# Patient Record
Sex: Male | Born: 1995 | Hispanic: Yes | Marital: Single | Smoking: Never smoker
Health system: Southern US, Community
[De-identification: ages and names within clinical notes are randomized; demographics above are authoritative.]

---

## 2016-03-26 ENCOUNTER — Emergency Department (HOSPITAL_COMMUNITY): Payer: Self-pay

## 2016-03-26 ENCOUNTER — Emergency Department (HOSPITAL_COMMUNITY)
Admission: EM | Admit: 2016-03-26 | Discharge: 2016-03-26 | Disposition: A | Discharge status: Home / Self Care | Payer: Self-pay | Attending: Emergency Medicine | Admitting: Emergency Medicine

## 2016-03-26 ENCOUNTER — Encounter (HOSPITAL_COMMUNITY): Payer: Self-pay

## 2016-03-26 DIAGNOSIS — S59912A Unspecified injury of left forearm, initial encounter: Secondary | ICD-10-CM | POA: Insufficient documentation

## 2016-03-26 DIAGNOSIS — W132XXA Fall from, out of or through roof, initial encounter: Secondary | ICD-10-CM | POA: Insufficient documentation

## 2016-03-26 DIAGNOSIS — S79911A Unspecified injury of right hip, initial encounter: Secondary | ICD-10-CM | POA: Insufficient documentation

## 2016-03-26 DIAGNOSIS — S79921A Unspecified injury of right thigh, initial encounter: Secondary | ICD-10-CM | POA: Insufficient documentation

## 2016-03-26 DIAGNOSIS — Y998 Other external cause status: Secondary | ICD-10-CM | POA: Insufficient documentation

## 2016-03-26 DIAGNOSIS — Y9289 Other specified places as the place of occurrence of the external cause: Secondary | ICD-10-CM | POA: Insufficient documentation

## 2016-03-26 DIAGNOSIS — S4991XA Unspecified injury of right shoulder and upper arm, initial encounter: Secondary | ICD-10-CM | POA: Insufficient documentation

## 2016-03-26 DIAGNOSIS — Y9389 Activity, other specified: Secondary | ICD-10-CM | POA: Insufficient documentation

## 2016-03-26 DIAGNOSIS — S3992XA Unspecified injury of lower back, initial encounter: Secondary | ICD-10-CM | POA: Insufficient documentation

## 2016-03-26 DIAGNOSIS — W19XXXA Unspecified fall, initial encounter: Secondary | ICD-10-CM

## 2016-03-26 MED ORDER — NAPROXEN 250 MG PO TABS
500.0000 mg | ORAL_TABLET | Freq: Once | ORAL | Status: AC
  Administered 2016-03-26: 500 mg via ORAL
  Filled 2016-03-26: qty 2

## 2016-03-26 MED ORDER — DIAZEPAM 5 MG PO TABS: 5.0000 mg | ORAL_TABLET | Freq: Two times a day (BID) | ORAL | Status: AC

## 2016-03-26 MED ORDER — DIAZEPAM 5 MG PO TABS
5.0000 mg | ORAL_TABLET | Freq: Once | ORAL | Status: AC
  Administered 2016-03-26: 5 mg via ORAL
  Filled 2016-03-26: qty 1

## 2016-03-26 MED ORDER — NAPROXEN 500 MG PO TABS: 500.0000 mg | ORAL_TABLET | Freq: Two times a day (BID) | ORAL | Status: AC

## 2016-03-26 NOTE — Discharge Instructions (Signed)
Mr. Seth Alvarez,  Nice meeting you! Please follow-up with your primary care provider. If you do not have one, call the number attached. Return to the emergency department if you develop increased pain, lose consciousness, develop headaches, chest pain, shortness of breath, dizziness, lose control of your bladder/bowel, new/worsening symptoms. Feel better soon!  S. Lane HackerNicole Pearlene Teat, PA-C

## 2016-03-26 NOTE — ED Notes (Addendum)
Patient fell from 2 story roof this am at 10am. Patient grabbed rope and helped break the fall, no loc. Complains of thoracic back pain and right leg pain. Ambulatory on arrival, no deformity noted. Alert and oriented.

## 2016-03-26 NOTE — ED Notes (Signed)
Pt states he fell off of roof, fall broken by harness. C/o right shoulder pain, right thigh pain. Abrasion left posterior forearm.

## 2016-03-26 NOTE — ED Provider Notes (Signed)
CSN: [id number]     Arrival date & time 03/26/16  1410 History  By signing my name below, I, [facility], attest that this documentation has been prepared under the direction and in the presence of Melton KrebsSamantha Nicole Folashade Gamboa, PA-C. Electronically Signed: [name] PatientMarrissa Alvarez, ED Scribe. 03/26/2016. 5:22 PM.   Chief Complaint  Patient presents with  . fall from roof    The history is provided by the patient. No language interpreter was used.   HPI Comments: Seth Alvarez is a 20 y.o. male who presents to the Emergency Department complaining of constant, mild right shoulder, right thigh, and back pain after a fall from a 2-story roof that occurred 5.5 hours ago. Pt states that he was wearing a harness when he fell backwards off the roof, landing on his back and striking his head and the right side of his body on the ground. He reports abrasions to his left forearm. He notes that his harness broke his fall and that he was able to move all extremities without difficulty following impact. Denies taking blood thinners, aspirin, ibuprofen, or any other medications. Denies difficult ambulation LOC, emesis, bowel/bladder incontinence.  History reviewed. No pertinent past medical history. History reviewed. No pertinent past surgical history. No family history on file. Social History  Substance Use Topics  . Smoking status: Never Smoker   . Smokeless tobacco: None  . Alcohol Use: None    Review of Systems A complete 10 system review of systems was obtained and all systems are negative except as noted in the HPI and PMH.   Allergies  Review of patient's allergies indicates not on file.  Home Medications   Prior to Admission medications   Medication Sig Start Date End Date Taking? Authorizing Provider  diazepam (VALIUM) 5 MG tablet Take 1 tablet (5 mg total) by mouth 2 (two) times daily. 03/26/16   Melton KrebsSamantha Nicole Jiya Kissinger, PA-C  naproxen (NAPROSYN) 500 MG tablet Take 1 tablet (500 mg total) by  mouth 2 (two) times daily. 03/26/16   Melton KrebsSamantha Nicole Melika Reder, PA-C   BP 141/89 mmHg  Pulse 78  Temp(Src) 98.6 F (37 C) (Oral)  Resp 18  Ht 5\' 7"  (1.702 m)  Wt 144 lb 1.6 oz (65.363 kg)  BMI 22.56 kg/m2  SpO2 100% Physical Exam  Constitutional: He is oriented to person, place, and time. He appears well-developed and well-nourished. No distress.  HENT:  Head: Normocephalic and atraumatic.  Eyes: Conjunctivae are normal.  Neck: Normal range of motion.  Cardiovascular: Normal rate.   Pulmonary/Chest: Effort normal. No respiratory distress.  Musculoskeletal: Normal range of motion. He exhibits tenderness.  Midline lumbar spine tenderness. Right anterior thigh tenderness. Right paraspinal tenderness. Right hip tenderness.  Neurological: He is alert and oriented to person, place, and time.  Skin: Skin is warm and dry.  Psychiatric: He has a normal mood and affect. His behavior is normal.  Nursing note and vitals reviewed.   ED Course  Procedures   DIAGNOSTIC STUDIES: Oxygen Saturation is 100% on RA, normal by my interpretation.    COORDINATION OF CARE: 3:38 PM Discussed results of right hip and right shoulder imaging. Will order x-rays or right femur and lumbar spine and pain medication. Discussed treatment plan with pt at bedside and pt agreed to plan.  Imaging Review Dg Lumbar Spine Complete  03/26/2016  CLINICAL DATA:  Status post fall from second story roof this afternoon. Low back pain. Initial encounter. EXAM: LUMBAR SPINE - COMPLETE 4+ VIEW COMPARISON:  None. FINDINGS:  There is no evidence of lumbar spine fracture. Alignment is normal. Intervertebral disc spaces are maintained. IMPRESSION: Negative exam. Electronically Signed   By: Drusilla Kanner M.D.   On: 03/26/2016 16:49   Dg Shoulder Right  03/26/2016  CLINICAL DATA:  Larey Seat off roof today at work about 10 a.m., right shoulder pain, right hip pain EXAM: RIGHT SHOULDER - 2+ VIEW COMPARISON:  None. FINDINGS: Three views of the  right shoulder submitted. No acute fracture or subluxation. No radiopaque foreign body. IMPRESSION: Negative. Electronically Signed   By: Natasha Mead M.D.   On: 03/26/2016 14:57   Dg Hip Unilat With Pelvis 2-3 Views Right  03/26/2016  CLINICAL DATA:  Larey Seat from roof at work today x 10 am this morning, landed on back, right shoulder/hip pain. EXAM: DG HIP (WITH OR WITHOUT PELVIS) 2-3V RIGHT COMPARISON:  None. FINDINGS: There is no evidence of hip fracture or dislocation. There is no evidence of arthropathy or other focal bone abnormality. IMPRESSION: Negative. Electronically Signed   By: Amie Portland M.D.   On: 03/26/2016 14:55   Dg Femur, Min 2 Views Right  03/26/2016  CLINICAL DATA:  Larey Seat off of a second story roof this afternoon EXAM: RIGHT FEMUR 2 VIEWS COMPARISON:  None. FINDINGS: There is no evidence of fracture or other focal bone lesions. Soft tissues are unremarkable. IMPRESSION: Negative. Electronically Signed   By: Signa Kell M.D.   On: 03/26/2016 16:57   I have personally reviewed and evaluated these images as part of my medical decision-making.   MDM   Final diagnoses:  Fall, initial encounter   S/p mechanical fall from roof. Patient without signs of serious head, neck, or back injury. Not TTP at chest or abd.   Normal neurological exam. No concern for closed head injury, lung injury, or intraabdominal injury. Normal muscle soreness after fall. Radiology without acute abnormality.  Patient is able to ambulate without difficulty in the ED and will be discharged home with symptomatic therapy. Pt has been instructed to follow up with their doctor if symptoms persist. Home conservative therapies for pain including ice and heat tx have been discussed. Pt is hemodynamically stable, in NAD. Pain has been managed & has no complaints prior to dc.  I personally performed the services described in this documentation, which was scribed in my presence. The recorded information has been reviewed  and is accurate.   Melton Krebs, PA-C 03/31/16 1018  Gerhard Munch, MD 04/04/16 [phone]

## 2016-05-18 ENCOUNTER — Ambulatory Visit (INDEPENDENT_AMBULATORY_CARE_PROVIDER_SITE_OTHER): Payer: Self-pay | Admitting: Urgent Care

## 2016-05-18 VITALS — BP 122/72 | HR 73 | Temp 98.3°F | Resp 12 | Ht 67.0 in | Wt 141.0 lb

## 2016-05-18 DIAGNOSIS — Z202 Contact with and (suspected) exposure to infections with a predominantly sexual mode of transmission: Secondary | ICD-10-CM

## 2016-05-18 MED ORDER — AZITHROMYCIN 500 MG PO TABS
500.0000 mg | ORAL_TABLET | Freq: Every day | ORAL | Status: DC
Start: 2016-05-18 — End: 2018-05-08

## 2016-05-18 NOTE — Progress Notes (Signed)
    MRN: [id number] DOB: [birth year]  Subjective:   Seth Alvarez is a 20 y.o. male presenting for chief complaint of Exposure to STD  Reports that his girlfriend tested positive for chlamydia 2 weeks ago. She has been treated and he is presenting today for treatment as well. Denies fever, penile discharge, dysuria, hematuria, n/v, abdominal pain, genital rashes.  Patient states that he has only been sexually active with one person, I counseled him on safe sex practices.  Norva Pavlovdgar has a current medication list which includes the following prescription(s): diazepam and naproxen. Also has No Known Allergies.  Norva Pavlovdgar  has no past medical history on file. Also  has no past surgical history on file.  Objective:   Vitals: BP 122/72 mmHg  Pulse 73  Temp(Src) 98.3 F (36.8 C) (Oral)  Resp 12  Ht 5\' 7"  (1.702 m)  Wt 141 lb (63.957 kg)  BMI 22.08 kg/m2  SpO2 98%  Physical Exam  Constitutional: He is oriented to person, place, and time. He appears well-developed and well-nourished.  Cardiovascular: Normal rate.   Pulmonary/Chest: Effort normal.  Neurological: He is alert and oriented to person, place, and time.  Psychiatric: He has a normal mood and affect.   Assessment and Plan :   1. Exposure to STD - Start treatment for chlamydia, patient declines STI testing. [facility]  Wallis BambergMario Yekaterina Escutia, PA-C [facility] [phone] 05/18/2016 11:08 AM

## 2016-05-18 NOTE — Patient Instructions (Addendum)
[facility]  Phone: [phone] Address: [address] [place], [place], [place] [address]   Clamidia en los hombres (Chlamydia, Male) La clamidia es una infeccin. Se contagia a travs del contacto sexual. Puede afectar diferentes partes del cuerpo. Estas partes incluyen la uretra, la garganta y el recto. Es necesario tratar la clamidia tan pronto como sea posible. Puede daar a otros rganos.  CAUSAS  La clamidia es una infeccin causada por una bacteria. La clamidia es una enfermedad de transmisin sexual. Esto significa que se trasmite a travs de una pareja sexual infectada con el contacto ntimo. El contacto puede ser por genitales, boca o zona rectal.  SIGNOS Y SNTOMAS  Puede ser que no haya sntomas. Esto es probable cuando la infeccin es reciente. Si tiene sntomas, generalmente son leves y solo los notar por la [place]. Entre los sntomas se pueden incluir:   Ardor al Geographical information systems officer.  Dolor o hinchazn en los testculos.  Secreciones mucosas que provienen del pene.  Dolor plvico de larga duracin (crnico) despus de las infecciones frecuentes.  Dolor, hinchazn o picazn en la zona que rodea el ano.  Dolor de Advertising copywriter.  Picazn, ardor o enrojecimiento en los ojos o secreciones en los ojos. DIAGNSTICO  Para diagnosticar esta infeccin, el mdico que lo Designer, industrial/product un examen pelviano. Podrn tomarle Lauris Poag de [place] o un cultivo del recto para hacer el anlisis.  TRATAMIENTO  La clamidia se trata con antibiticos. El mdico puede repetirle las pruebas de deteccin de infecciones 3 meses despus del [place]. INSTRUCCIONES PARA EL CUIDADO EN EL HOGAR  Tome los antibiticos como le indic el mdico. Termine los antibiticos aunque comience a sentirse mejor. Un tratamiento incompleto lo pondr en riesgo de no poder tener hijos (esterilidad).  Tome los medicamentos solamente como se lo haya indicado el mdico.  Haga reposo.  Informe a sus  compaeros sexuales de su infeccin. Incluso aunque no tengan sntomas o el cultivo haya dado negativo, debern tratarse.  No tenga relaciones sexuales hasta que haya completado el tratamiento y el mdico lo autorice.  Concurra a todas las visitas de control como se lo haya indicado el mdico.  No todos los resultados estarn disponibles durante su visita. Si los [employer] estudios no estn listos durante la visita, tenga otra entrevista con su mdico para conocerlos. No suponga que los resultados son normales si no tiene noticias del mdico o del centro mdico. Es su responsabilidad retirar el resultado del [place]. SOLICITE ATENCIN MDICA SI:  Siente un dolor nuevo en las articulaciones.  Tiene fiebre. SOLICITE ATENCIN MDICA DE INMEDIATO SI:   El dolor aumenta.  Brett Fairy secrecin anormal.  Siente dolor durante las relaciones sexuales. ASEGRESE DE QUE:   Comprende estas instrucciones.  Controlar su afeccin.  Recibir ayuda de inmediato si no mejora o si empeora.   Esta informacin no tiene Theme park manager el consejo del mdico. Asegrese de hacerle al mdico cualquier pregunta que tenga.   Document Released: 11/11/2005 Document Revised: 12/02/2014 Elsevier Interactive Patient Education 2016 [employer].    Azithromycin tablets Qu es este medicamento? La AZITROMICINA es un antibitico macrlido. Se utiliza para tratar o prevenir ciertos tipos de infecciones bacterianas. No es efectivo para resfros, gripe u otras infecciones de origen viral. Este medicamento puede ser utilizado para otros usos; si tiene alguna pregunta consulte con su proveedor de atencin mdica o con su farmacutico. Qu le debo informar a mi profesional de la salud antes de Regulatory affairs officer  medicamento? Necesita saber si usted presenta alguno de los siguientes problemas o situaciones: -enfermedad renal -enfermedad heptica -enfermedad cardaca o latido cardaco irregular -una  reaccin alrgica o inusual a la azitromicina, a la eritromicina, otros antibiticos macrlidos, alimentos, colorantes o conservadores -si est embarazada o buscando quedar embarazada -si est amamantando a un beb Cmo debo utilizar este medicamento? Tome este medicamento por va oral con un vaso lleno de agua. Siga las instrucciones de la etiqueta del [place]medicamento. Las tabletas se pueden tomar con alimentos o con el estmago vaco. Si el medicamento le produce Programme researcher, broadcasting/film/videomalestar estomacal, tmelo con alimentos. Tome sus dosis a intervalos regulares. No tome su medicamento con una frecuencia mayor que la indicada. Tome todas las dosis de su medicamento como se le haya indicado aun si se siente mejor. No omita ninguna dosis o suspenda el uso de su medicamento antes de lo indicado. Hable con su pediatra para informarse acerca del uso de este medicamento en nios. Puede requerir atencin especial. Sobredosis: Pngase en contacto inmediatamente con un centro toxicolgico o una sala de urgencia si usted cree que haya tomado demasiado medicamento. ATENCIN: Reynolds AmericanEste medicamento es solo para usted. No comparta este medicamento con nadie. Qu sucede si me olvido de una dosis? Si olvida una dosis, tmela lo antes posible. Si es casi la hora de la prxima dosis, tome slo esa dosis. No tome dosis adicionales o dobles. Qu puede interactuar con este medicamento? No tome esta medicina con ninguno de los siguientes medicamentos: -lincomicina Esta medicina tambin puede interactuar con los siguientes medicamentos: -amiodarona -anticidos -pldoras anticonceptivas -ciclosporina -digoxina -magnesio -nelfinavir -fenitona -warfarina Puede ser que esta lista no menciona todas las posibles interacciones. Informe a su profesional de Beazer Homesla salud de [employer] Inctodos los productos a base de hierbas, medicamentos de [place] libre o suplementos nutritivos que est tomando. Si usted fuma, consume bebidas alcohlicas o si utiliza drogas ilegales,  indqueselo tambin a su profesional de Beazer Homesla salud. Algunas sustancias pueden interactuar con su medicamento. A qu debo estar atento al usar PPL Corporationeste medicamento? Si los sntomas no mejoran, consulte con su mdico o con su profesional de Beazer Homesla salud. No trate la diarrea con productos de H. J. Heinzventa libre. Comunquese con su mdico si tiene diarrea que dura ms de 2 das o si es severa y [place]. Este medicamento puede aumentar la sensibilidad al sol. Mantngase fuera de Secretary/administratorla luz solar. Si no lo puede evitar, utilice ropa protectora y crema de Orthoptistproteccin solar. No utilice lmparas solares, camas solares ni cabinas solares. Qu efectos secundarios puedo tener al Boston Scientificutilizar este medicamento? Efectos secundarios que debe informar a su mdico o a Producer, television/film/videosu profesional de la salud tan pronto como sea posible: -Therapist, artreacciones alrgicas como erupcin cutnea, picazn o urticarias, hinchazn de la cara, labios o lengua -confusin, pesadillas o alucinaciones -orina de color amarillo oscuro -dificultad al respirar -prdida de la audicin -pulso cardaco irregular o dolor en el pecho -dolor o dificultad para Geographical information systems officerorinar -enrojecimiento, formacin de ampollas, descamacin o aflojamiento de la piel, inclusive dentro de la boca -manchas blancas o llagas dentro de la boca -color amarillento de ojos o piel Efectos secundarios que, por lo general, no requieren atencin mdica (debe informarlos a su mdico o a su profesional de la salud si persisten o si son molestos): -diarrea -mareos, somnolencia -dolor de cabeza -Programme researcher, broadcasting/film/videomalestar estomacal o vmito -decoloracin dental -irritacin vaginal Puede ser que esta lista no menciona todos los posibles efectos secundarios. Comunquese a su mdico por asesoramiento mdico Hewlett-Packardsobre los efectos secundarios. Usted puede informar los efectos secundarios  a la FDA por telfono al 1-800-FDA-1088. Dnde debo guardar mi medicina? Mantngala fuera del alcance de los nios. Gurdela a Sanmina-SCItemperatura ambiente, entre 15 y  30 grados C ([phone] y 2186 grados F). Deseche todo el medicamento que no haya utilizado, despus de la fecha de vencimiento. ATENCIN: Este folleto es un resumen. Puede ser que no cubra toda la posible informacin. Si usted tiene preguntas acerca de esta medicina, consulte con su mdico, su farmacutico o su profesional de Radiographer, therapeuticla salud.    2016, Elsevier/Gold Standard. (2015-01-03 00:00:00)    IF you received an x-ray today, you will receive an invoice from [facility]. Please contact [facility] at [phone] with questions or concerns regarding your invoice.   IF you received labwork today, you will receive an invoice from [facility]. Please contact [facility] at [phone] with questions or concerns regarding your invoice.   Our billing staff will not be able to assist you with questions regarding bills from these companies.  You will be contacted with the lab results as soon as they are available. The fastest way to get your results is to activate your My Chart account. Instructions are located on the last page of this paperwork. If you have not heard from us regarding the results in 2 weeks, please contact this office.

## 2018-05-08 ENCOUNTER — Emergency Department (HOSPITAL_COMMUNITY): Payer: Self-pay

## 2018-05-08 ENCOUNTER — Other Ambulatory Visit: Payer: Self-pay

## 2018-05-08 ENCOUNTER — Encounter (HOSPITAL_COMMUNITY): Payer: Self-pay

## 2018-05-08 ENCOUNTER — Emergency Department (HOSPITAL_COMMUNITY)
Admission: EM | Admit: 2018-05-08 | Discharge: 2018-05-08 | Disposition: A | Discharge status: Home / Self Care | Payer: Self-pay | Attending: Emergency Medicine | Admitting: Emergency Medicine

## 2018-05-08 DIAGNOSIS — Y929 Unspecified place or not applicable: Secondary | ICD-10-CM | POA: Insufficient documentation

## 2018-05-08 DIAGNOSIS — S2231XA Fracture of one rib, right side, initial encounter for closed fracture: Secondary | ICD-10-CM | POA: Insufficient documentation

## 2018-05-08 DIAGNOSIS — Y939 Activity, unspecified: Secondary | ICD-10-CM | POA: Insufficient documentation

## 2018-05-08 DIAGNOSIS — W132XXA Fall from, out of or through roof, initial encounter: Secondary | ICD-10-CM | POA: Insufficient documentation

## 2018-05-08 DIAGNOSIS — Y999 Unspecified external cause status: Secondary | ICD-10-CM | POA: Insufficient documentation

## 2018-05-08 DIAGNOSIS — S42021A Displaced fracture of shaft of right clavicle, initial encounter for closed fracture: Secondary | ICD-10-CM | POA: Insufficient documentation

## 2018-05-08 LAB — URINALYSIS, ROUTINE W REFLEX MICROSCOPIC
BILIRUBIN URINE: NEGATIVE
Glucose, UA: NEGATIVE mg/dL
Hgb urine dipstick: NEGATIVE
KETONES UR: 20 mg/dL — AB
LEUKOCYTES UA: NEGATIVE
NITRITE: NEGATIVE
PH: 5 (ref 5.0–8.0)
Protein, ur: NEGATIVE mg/dL
Specific Gravity, Urine: >1.046 — ABNORMAL HIGH (ref 1.005–1.030)

## 2018-05-08 LAB — COMPREHENSIVE METABOLIC PANEL
ALK PHOS: 45 U/L (ref 38–126)
ALT: 30 U/L (ref 17–63)
AST: 39 U/L (ref 15–41)
Albumin: 4.3 g/dL (ref 3.5–5.0)
Anion gap: 7 (ref 5–15)
BILIRUBIN TOTAL: 1.9 mg/dL — AB (ref 0.3–1.2)
BUN: 13 mg/dL (ref 6–20)
CALCIUM: 9 mg/dL (ref 8.9–10.3)
CHLORIDE: 106 mmol/L (ref 101–111)
CO2: 23 mmol/L (ref 22–32)
CREATININE: 0.81 mg/dL (ref 0.61–1.24)
Glucose, Bld: 103 mg/dL — ABNORMAL HIGH (ref 65–99)
Potassium: 4.4 mmol/L (ref 3.5–5.1)
Sodium: 136 mmol/L (ref 135–145)
TOTAL PROTEIN: 7.4 g/dL (ref 6.5–8.1)

## 2018-05-08 LAB — CBC WITH DIFFERENTIAL/PLATELET
Abs Immature Granulocytes: 0.1 10*3/uL (ref 0.0–0.1)
BASOS ABS: 0.1 10*3/uL (ref 0.0–0.1)
BASOS PCT: 0 %
EOS ABS: 0 10*3/uL (ref 0.0–0.7)
Eosinophils Relative: 0 %
HCT: 44.5 % (ref 39.0–52.0)
Hemoglobin: 14.8 g/dL (ref 13.0–17.0)
IMMATURE GRANULOCYTES: 1 %
Lymphocytes Relative: 9 %
Lymphs Abs: 1 10*3/uL (ref 0.7–4.0)
MCH: 31.4 pg (ref 26.0–34.0)
MCHC: 33.3 g/dL (ref 30.0–36.0)
MCV: 94.3 fL (ref 78.0–100.0)
Monocytes Absolute: 0.6 10*3/uL (ref 0.1–1.0)
Monocytes Relative: 5 %
NEUTROS PCT: 85 %
Neutro Abs: 10.1 10*3/uL — ABNORMAL HIGH (ref 1.7–7.7)
PLATELETS: 312 10*3/uL (ref 150–400)
RBC: 4.72 MIL/uL (ref 4.22–5.81)
RDW: 13.1 % (ref 11.5–15.5)
WBC: 11.9 10*3/uL — AB (ref 4.0–10.5)

## 2018-05-08 MED ORDER — MORPHINE SULFATE (PF) 4 MG/ML IV SOLN
4.0000 mg | Freq: Once | INTRAVENOUS | Status: AC
  Administered 2018-05-08: 4 mg via INTRAVENOUS
  Filled 2018-05-08: qty 1

## 2018-05-08 MED ORDER — IOHEXOL 300 MG/ML  SOLN
100.0000 mL | Freq: Once | INTRAMUSCULAR | Status: AC | PRN
  Administered 2018-05-08: 100 mL via INTRAVENOUS

## 2018-05-08 MED ORDER — KETOROLAC TROMETHAMINE 30 MG/ML IJ SOLN
30.0000 mg | Freq: Once | INTRAMUSCULAR | Status: AC
  Administered 2018-05-08: 30 mg via INTRAVENOUS
  Filled 2018-05-08: qty 1

## 2018-05-08 MED ORDER — HYDROCODONE-ACETAMINOPHEN 5-325 MG PO TABS: 1.0000 | ORAL_TABLET | Freq: Four times a day (QID) | ORAL | 0 refills | Status: AC | PRN

## 2018-05-08 MED ORDER — IBUPROFEN 800 MG PO TABS: 800.0000 mg | ORAL_TABLET | Freq: Three times a day (TID) | ORAL | 0 refills | Status: AC

## 2018-05-08 NOTE — ED Provider Notes (Signed)
[facility] EMERGENCY DEPARTMENT Provider Note   CSN: [id number] Arrival date & time: 05/08/18  7829     History   Chief Complaint No chief complaint on file.   HPI [facility] is a 22 y.o. male.  HPI She fell approximately 15 feet from a roof.  He reports he first landed on a porch roof and then hit the ground on his right side.  He states he has a lot of pain in his right shoulder and clavicle area.  He also has pain into the back and under the arm on the right side.  Some pain with deep inspiration but no shortness of breath.  He reports he did not get knocked out.  No headache.  He reports he immediately got up and was ambulatory after the event.  No weakness numbness tingling to the extremities.  No other medical history. History reviewed. No pertinent past medical history.  There are no active problems to display for this patient.   History reviewed. No pertinent surgical history.      Home Medications    Prior to Admission medications   Medication Sig Start Date End Date Taking? Authorizing Provider  diazepam (VALIUM) 5 MG tablet Take 1 tablet (5 mg total) by mouth 2 (two) times daily. Patient not taking: Reported on 05/08/2018 03/26/16   Melton Krebs, PA-C  HYDROcodone-acetaminophen (NORCO/VICODIN) 5-325 MG tablet Take 1-2 tablets by mouth every 6 (six) hours as needed for moderate pain or severe pain. 05/08/18   Arby Barrette, MD  ibuprofen (ADVIL,MOTRIN) 800 MG tablet Take 1 tablet (800 mg total) by mouth 3 (three) times daily. 05/08/18   Arby Barrette, MD  naproxen (NAPROSYN) 500 MG tablet Take 1 tablet (500 mg total) by mouth 2 (two) times daily. Patient not taking: Reported on 05/08/2018 03/26/16   Melton Krebs, PA-C    Family History No family history on file.  Social History Social History   Tobacco Use  . Smoking status: Never Smoker  Substance Use Topics  . Alcohol use: Not on file  . Drug use: Not on file      Allergies   Patient has no known allergies.   Review of Systems Review of Systems  10 Systems reviewed and are negative for acute change except as noted in the HPI.  Physical Exam Updated Vital Signs BP 132/79   Pulse 93   Temp 97.7 F (36.5 C) (Oral)   Resp 16   Ht 5\' 7"  (1.702 m)   Wt 63.5 kg (140 lb)   SpO2 97%   BMI 21.93 kg/m   Physical Exam  Constitutional: He is oriented to person, place, and time.  Patient appears to be in pain.  He is alert and nontoxic.  Mental status clear.  GCS 15.  No respiratory distress.  Color good.  HENT:  Head: Normocephalic and atraumatic.  Right Ear: External ear normal.  Left Ear: External ear normal.  Nose: Nose normal.  Mouth/Throat: Oropharynx is clear and moist.  Eyes: Pupils are equal, round, and reactive to light. EOM are normal.  Neck: Neck supple.  Patient endorses discomfort to palpation in the midline cervical spine.  He does not localize this well.  No step-off deformities.  Cardiovascular: Normal rate, regular rhythm, normal heart sounds and intact distal pulses.  Pulmonary/Chest: Effort normal and breath sounds normal.  Endorses tenderness to the posterior thoracic chest wall on the right.  No crepitus or abrasion.  Abdominal: Soft. Bowel sounds are  normal. There is no tenderness.  Patient has minor abrasion to the right far lateral abdomen superior to the iliac crest.  Less than 5 cm.  Musculoskeletal:  Tenderness and moderate swelling in the mid clavicular area on the right.  Pain with any range of motion of the right shoulder.  No edema of the right arm or hand.  Neurovascularly intact.  Lower extremities normal with normal range of motion and no deformity.  Neurological: He is alert and oriented to person, place, and time. No cranial nerve deficit. He exhibits normal muscle tone. Coordination normal.  Skin: Skin is warm and dry.  Psychiatric: He has a normal mood and affect.     ED Treatments / Results   Labs (all labs ordered are listed, but only abnormal results are displayed) Labs Reviewed  COMPREHENSIVE METABOLIC PANEL - Abnormal; Notable for the following components:      Result Value   Glucose, Bld 103 (*)    Total Bilirubin 1.9 (*)    All other components within normal limits  CBC WITH DIFFERENTIAL/PLATELET - Abnormal; Notable for the following components:   WBC 11.9 (*)    Neutro Abs 10.1 (*)    All other components within normal limits  URINALYSIS, ROUTINE W REFLEX MICROSCOPIC    EKG None  Radiology Dg Shoulder Right  Result Date: 05/08/2018 CLINICAL DATA:  Pain following fall EXAM: RIGHT SHOULDER - 2+ VIEW COMPARISON:  Mar 26, 2016 FINDINGS: Internal rotation, external rotation, and Y scapular images were obtained. There is a comminuted fracture through the mid clavicle with inferior displacement laterally. There is approximately 2 cm of overriding of fracture fragments in this area. No other fractures are evident. No dislocation. Joint spaces appear normal. No erosive change. Visualized right lung is clear. IMPRESSION: Comminuted fracture of the mid clavicle on the right with inferior displacement laterally at approximately 2 cm of overriding of major fracture fragments. No other fracture.  No dislocation.  No appreciable arthropathy. Electronically Signed   By: Bretta Bang III M.D.   On: 05/08/2018 08:09   Ct Chest W Contrast  Result Date: 05/08/2018 CLINICAL DATA:  Fall, landing on right side with right chest and abdomen pain. EXAM: CT CHEST, ABDOMEN, AND PELVIS WITH CONTRAST TECHNIQUE: Multidetector CT imaging of the chest, abdomen and pelvis was performed following the standard protocol during bolus administration of intravenous contrast. CONTRAST:  OMNIPAQUE IOHEXOL 300 MG/ML  SOLN COMPARISON:  None. FINDINGS: CT CHEST FINDINGS Cardiovascular: Heart is normal size. Aorta is normal caliber. No evidence of aortic dissection or aortic injury. Mediastinum/Nodes: No  mediastinal, hilar, or axillary adenopathy. No evidence of mediastinal hematoma. Lungs/Pleura: No confluent airspace opacities, effusion or pneumothorax. Musculoskeletal: Comminuted mid right clavicle fracture noted. Fracture through the anterior right 2nd rib. CT ABDOMEN PELVIS FINDINGS Hepatobiliary: Calcification within the right hepatic lobe appears benign, possibly related to old granuloma. No evidence of attic injury or perihepatic hematoma. Pancreas: No focal abnormality or ductal dilatation. Spleen: No splenic injury or perisplenic hematoma. Adrenals/Urinary Tract: No adrenal hemorrhage or renal injury identified. Bladder is unremarkable. Stomach/Bowel: Stomach, large and small bowel grossly unremarkable. Vascular/Lymphatic: No evidence of aneurysm or adenopathy. Reproductive: No visible focal abnormality. Other: No free fluid or free air. Musculoskeletal: No acute bony abnormality. IMPRESSION: Comminuted fracture through the mid right clavicle. Anterior right 2nd rib fracture. No associated effusion or pneumothorax. No acute findings in the abdomen or pelvis. Electronically Signed   By: Charlett Nose M.D.   On: 05/08/2018 11:13  Ct Cervical Spine Wo Contrast  Result Date: 05/08/2018 CLINICAL DATA:  Pain following fall EXAM: CT CERVICAL SPINE WITHOUT CONTRAST TECHNIQUE: Multidetector CT imaging of the cervical spine was performed without intravenous contrast. Multiplanar CT image reconstructions were also generated. COMPARISON:  None. FINDINGS: Alignment: There is no appreciable spondylolisthesis. Skull base and vertebrae: The skull base and craniocervical junction regions appear normal. Note that the patient's head is turned to the right. No fracture is evident. No blastic or lytic bone lesions. Soft tissues and spinal canal: Prevertebral soft tissues and predental space regions are within normal limits. No paraspinous lesions are appreciable. No cord or canal hematoma. Disc levels: Disc spaces appear  unremarkable. No nerve root edema or effacement. No disc extrusion or stenosis. Upper chest: Visualized upper lung zones are clear. Other: There are benign presumed cystic areas in each thyroid cartilage region, slightly larger on the left than on the right. Visualized brain parenchyma appears normal. IMPRESSION: No appreciable fracture or spondylolisthesis. No evident arthropathy. Note that the patient's head is turned toward the right during this study. No atlantoaxial lesion is evident. Electronically Signed   By: Bretta BangWilliam  Woodruff III M.D.   On: 05/08/2018 11:19   Ct Abdomen Pelvis W Contrast  Result Date: 05/08/2018 CLINICAL DATA:  Fall, landing on right side with right chest and abdomen pain. EXAM: CT CHEST, ABDOMEN, AND PELVIS WITH CONTRAST TECHNIQUE: Multidetector CT imaging of the chest, abdomen and pelvis was performed following the standard protocol during bolus administration of intravenous contrast. CONTRAST:  100mL OMNIPAQUE IOHEXOL 300 MG/ML  SOLN COMPARISON:  None. FINDINGS: CT CHEST FINDINGS Cardiovascular: Heart is normal size. Aorta is normal caliber. No evidence of aortic dissection or aortic injury. Mediastinum/Nodes: No mediastinal, hilar, or axillary adenopathy. No evidence of mediastinal hematoma. Lungs/Pleura: No confluent airspace opacities, effusion or pneumothorax. Musculoskeletal: Comminuted mid right clavicle fracture noted. Fracture through the anterior right 2nd rib. CT ABDOMEN PELVIS FINDINGS Hepatobiliary: Calcification within the right hepatic lobe appears benign, possibly related to old granuloma. No evidence of attic injury or perihepatic hematoma. Pancreas: No focal abnormality or ductal dilatation. Spleen: No splenic injury or perisplenic hematoma. Adrenals/Urinary Tract: No adrenal hemorrhage or renal injury identified. Bladder is unremarkable. Stomach/Bowel: Stomach, large and small bowel grossly unremarkable. Vascular/Lymphatic: No evidence of aneurysm or adenopathy.  Reproductive: No visible focal abnormality. Other: No free fluid or free air. Musculoskeletal: No acute bony abnormality. IMPRESSION: Comminuted fracture through the mid right clavicle. Anterior right 2nd rib fracture. No associated effusion or pneumothorax. No acute findings in the abdomen or pelvis. Electronically Signed   By: Charlett NoseKevin  Dover M.D.   On: 05/08/2018 11:13    Procedures Procedures (including critical care time)  Medications Ordered in ED Medications  morphine 4 MG/ML injection 4 mg (4 mg Intravenous Given 05/08/18 0918)  iohexol (OMNIPAQUE) 300 MG/ML solution 100 mL (100 mLs Intravenous Contrast Given 05/08/18 1039)  ketorolac (TORADOL) 30 MG/ML injection 30 mg (30 mg Intravenous Given 05/08/18 1206)  morphine 4 MG/ML injection 4 mg (4 mg Intravenous Given 05/08/18 1205)     Initial Impression / Assessment and Plan / ED Course  I have reviewed the triage vital signs and the nursing notes.  Pertinent labs & imaging results that were available during my care of the patient were reviewed by me and considered in my medical decision making (see chart for details).    Consult: Reviewed with Dale DurhamMichael Jeffries.  At this time recommendation for sling and follow-up with Dr. Aundria Rudogers in the office.  Final Clinical Impressions(s) / ED Diagnoses   Final diagnoses:  Closed displaced fracture of shaft of right clavicle, initial encounter  Closed fracture of one rib of right side, initial encounter   Had a fall from estimated 15 feet.  He did hit the porch first and then to the ground.  CT scanning shows second rib fracture and identified clavicle fracture.  No solid organ injury no pneumothorax.  She is neurologically normal.  No headache loss of consciousness or signs of intracranial injury.  At this time will place in sling and swath and provide pain medications with follow-up as outpatient for clavicle fracture.  Return precautions reviewed. ED Discharge Orders        Ordered    ibuprofen  (ADVIL,MOTRIN) 800 MG tablet  3 times daily     05/08/18 1203    HYDROcodone-acetaminophen (NORCO/VICODIN) 5-325 MG tablet  Every 6 hours PRN     05/08/18 1203       Arby Barrette, MD 05/08/18 1213

## 2018-05-08 NOTE — ED Triage Notes (Signed)
Patient was working on roof and slipped off falling 12-15 feet, no loc. Complains of severe right shoulder and clavicle pain, also complains of right ankle/foot discomfort, no swelling, no deformity

## 2018-05-08 NOTE — ED Notes (Signed)
Pt verbalized understanding discharge instructions and denies any further needs or questions at this time. VS stable, ambulatory and steady gait.   See EDP assessment / note.   Pt denied need for Translator.

## 2018-05-08 NOTE — ED Notes (Signed)
Urine sent to lab 

## 2018-05-08 NOTE — ED Notes (Signed)
Urinal given to patient.

## 2019-03-14 IMAGING — CT CT CERVICAL SPINE W/O CM
3 of 4 series · 13 of 33 positions shown, 16 images · non-contrast
Comparison: None.

CLINICAL DATA: Pain following fall

EXAM:
CT CERVICAL SPINE WITHOUT CONTRAST
TECHNIQUE: Multidetector CT imaging of the cervical spine was performed without
intravenous contrast. Multiplanar CT image reconstructions were also
generated.

[Series 13: sag bone · sagittal · 0.31mm/px · 5 of 53 slices shown, 6 images]
[im 18/53  bone]
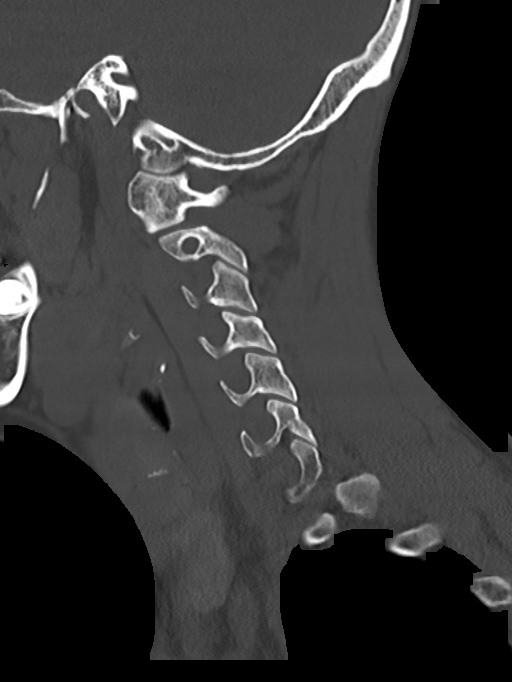
[im 22/53  bone]
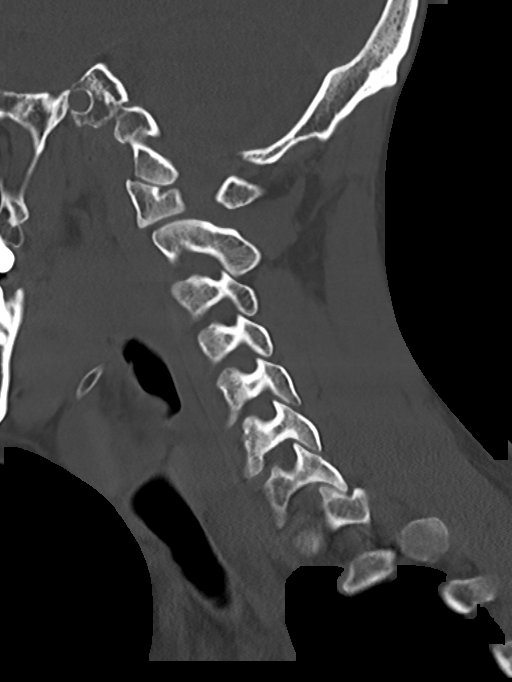
[im 27/53  soft-tissue]
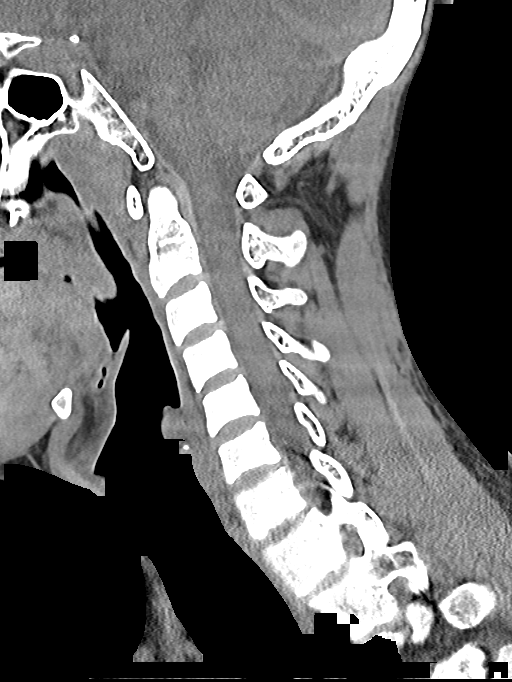
[im 27/53  bone]
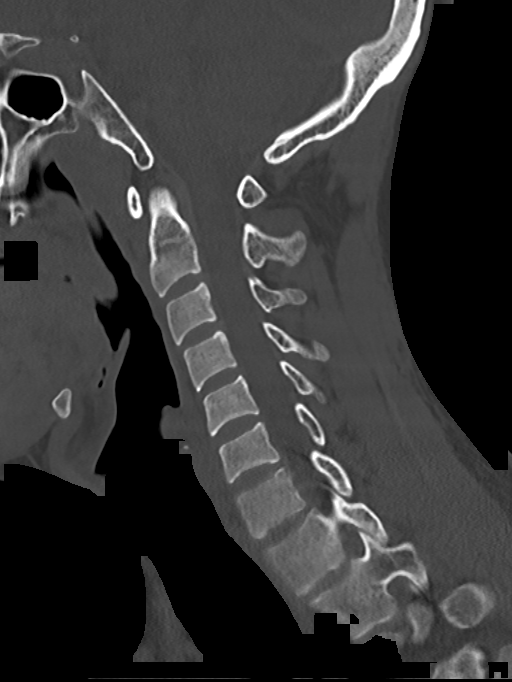
[im 31/53  bone]
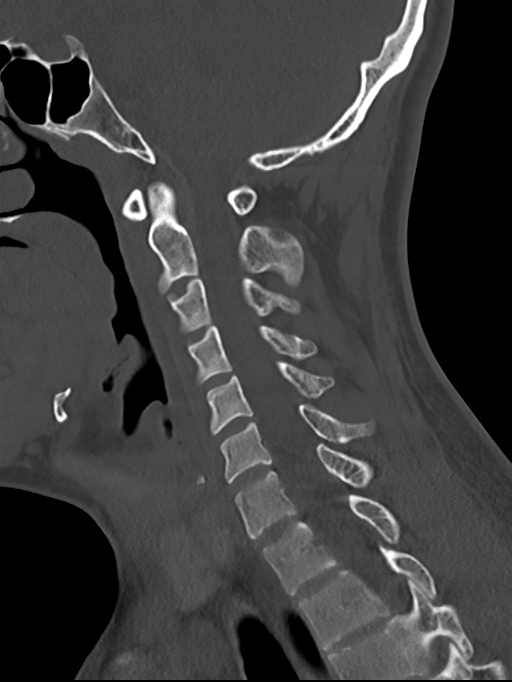
[im 35/53  bone]
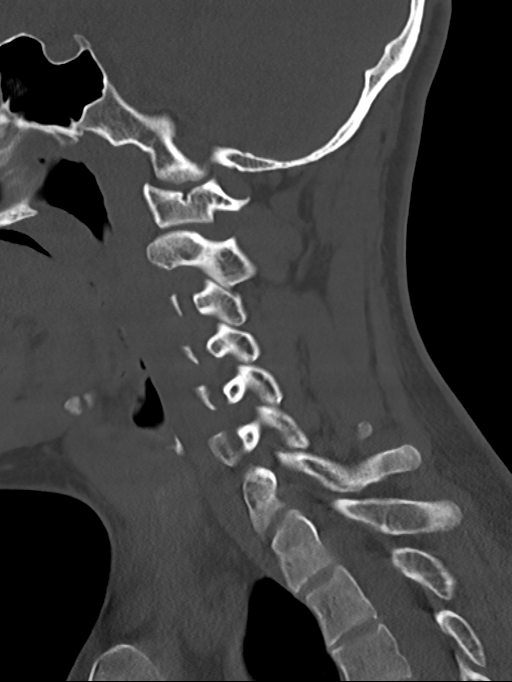

[Series 14: cor bone · coronal · 0.31mm/px · 3 of 61 slices shown]
[im 14/61  bone]
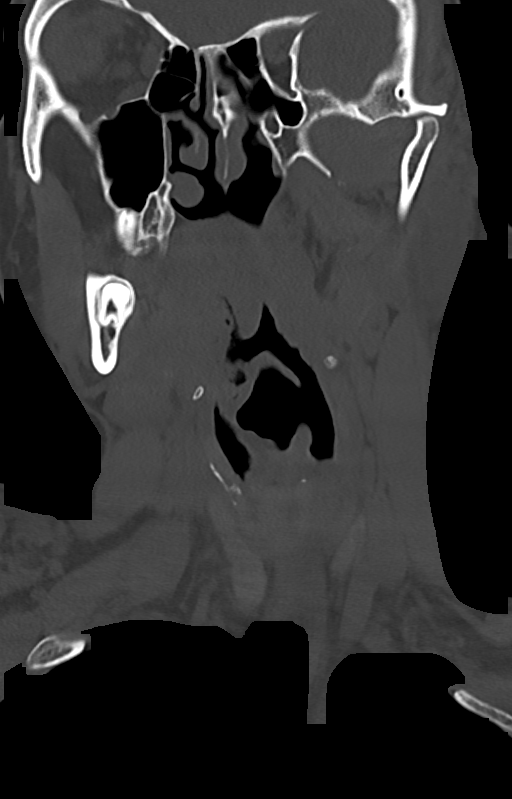
[im 25/61  bone]
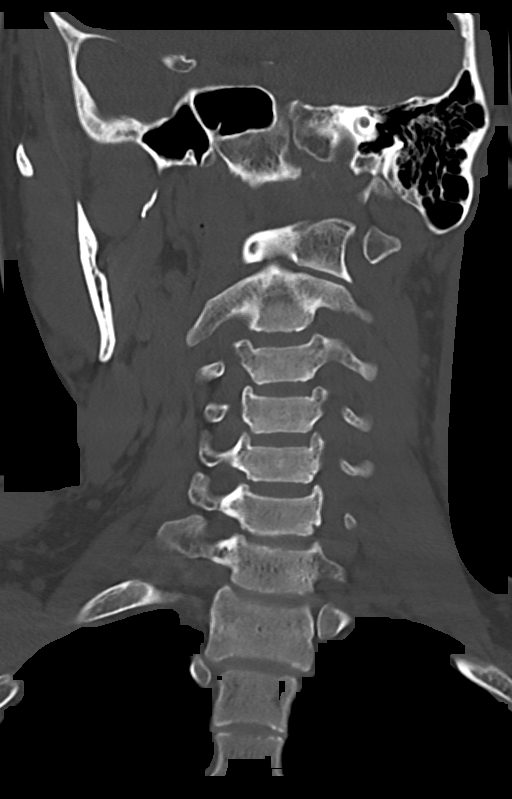
[im 36/61  bone]
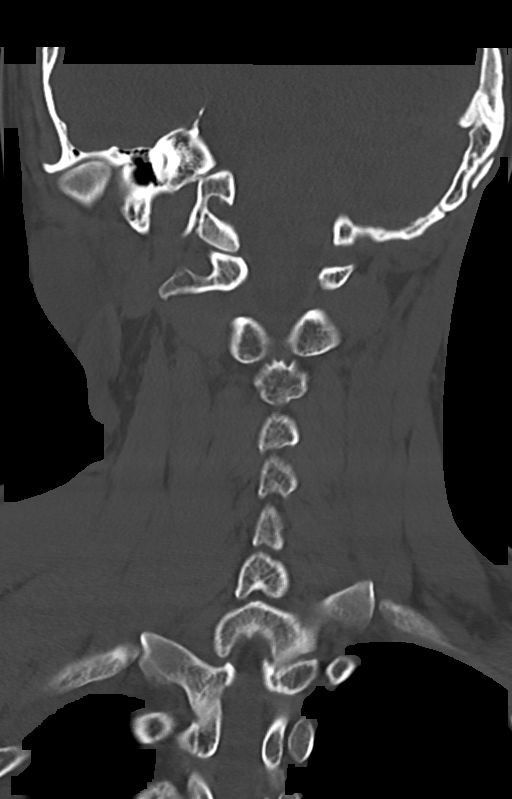

[Series 15: orthogonal axials · axial · 0.21mm/px · z∈[-322,-214]mm · 5 of 98 slices shown, 7 images]
[im 17/98  soft-tissue]
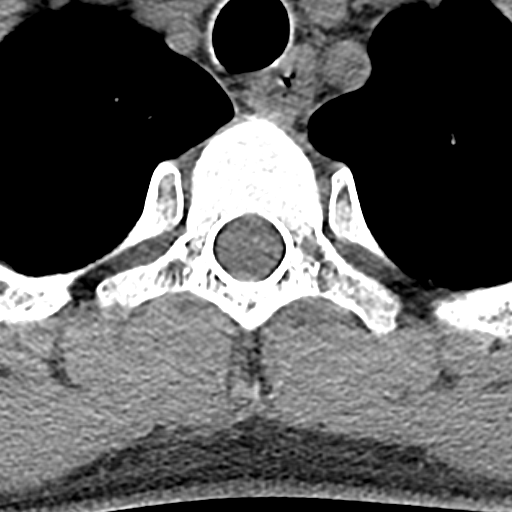
[im 17/98  bone]
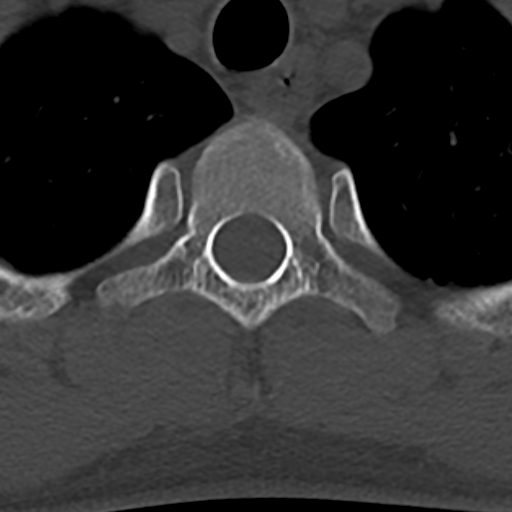
[im 33/98  bone]
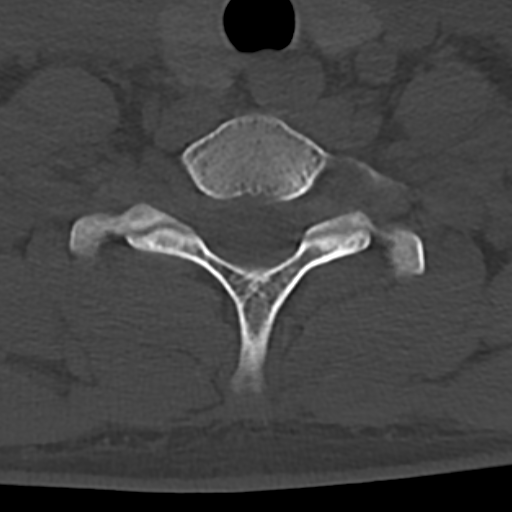
[im 49/98  bone]
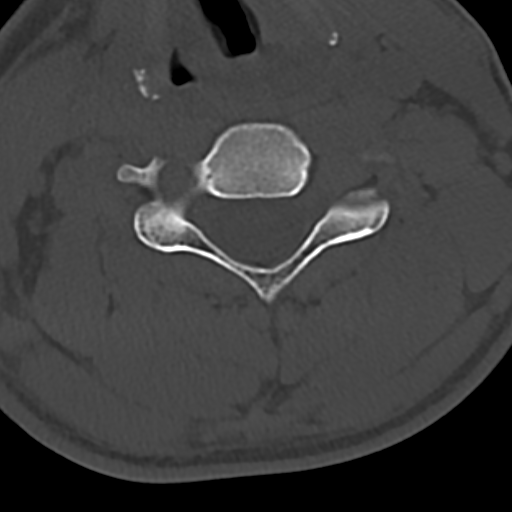
[im 65/98  bone]
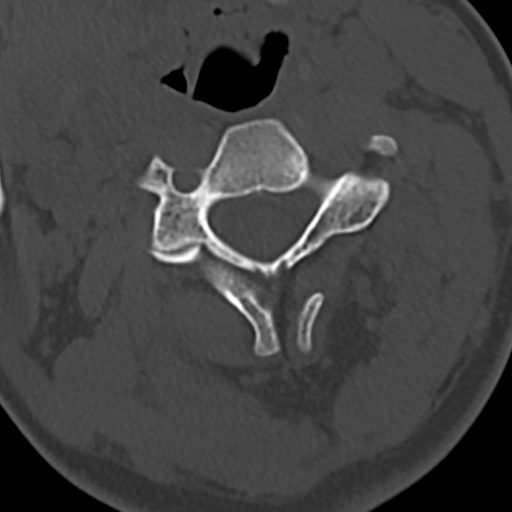
[im 81/98  soft-tissue]
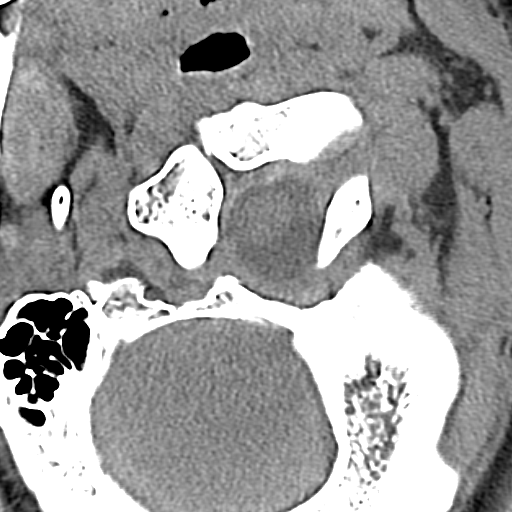
[im 81/98  bone]
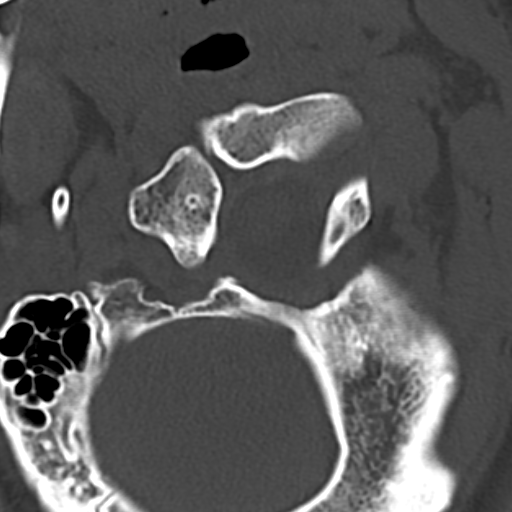

[13 of 33 positions shown; findings below may reference images not displayed]

FINDINGS: Alignment: There is no appreciable spondylolisthesis.

Skull base and vertebrae: The skull base and craniocervical junction
regions appear normal. Note that the patient's head is turned to the
right. No fracture is evident. No blastic or lytic bone lesions.

Soft tissues and spinal canal: Prevertebral soft tissues and
predental space regions are within normal limits. No paraspinous
lesions are appreciable. No cord or canal hematoma.

Disc levels: Disc spaces appear unremarkable. No nerve root edema or
effacement. No disc extrusion or stenosis.

Upper chest: Visualized upper lung zones are clear.

Other: There are benign presumed cystic areas in each thyroid
cartilage region, slightly larger on the left than on the right..
Visualized brain parenchyma appears normal.
IMPRESSION: No appreciable fracture or spondylolisthesis. No evident
arthropathy. Note that the patient's head is turned toward the right
during this study. No atlantoaxial lesion is evident.

## 2019-03-14 IMAGING — CT CT ABD-PELV W/ CM
2 of 5 series · 13 of 46 positions shown, 15 images · IV contrast (omnipaque)
Comparison: None.

CLINICAL DATA: Fall, landing on right side with right chest and
abdomen pain.

EXAM:
CT CHEST, ABDOMEN, AND PELVIS WITH CONTRAST
TECHNIQUE: Multidetector CT imaging of the chest, abdomen and pelvis was
performed following the standard protocol during bolus
administration of intravenous contrast.
CONTRAST:  100mL OMNIPAQUE IOHEXOL 300 MG/ML  SOLN

[Series 4: cap with · axial · 0.61mm/px · z∈[-857,-307]mm · 10 of 133 slices shown, 12 images]
[im 12/133  soft-tissue]
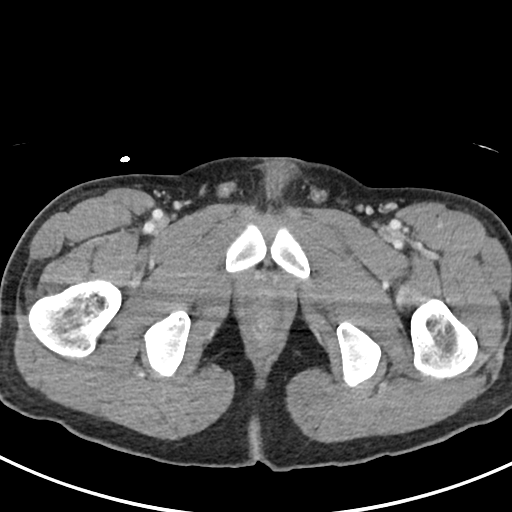
[im 12/133  bone]
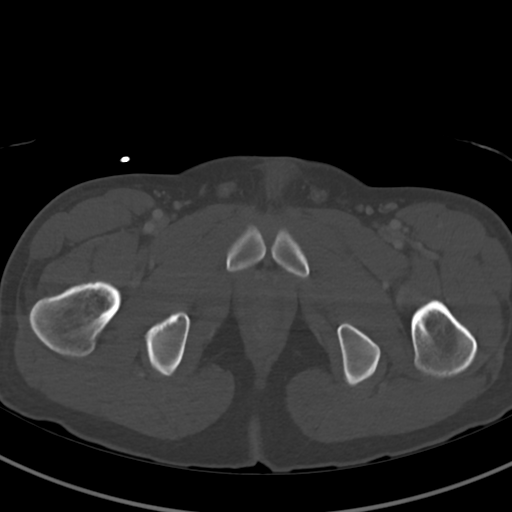
[im 23/133  soft-tissue]
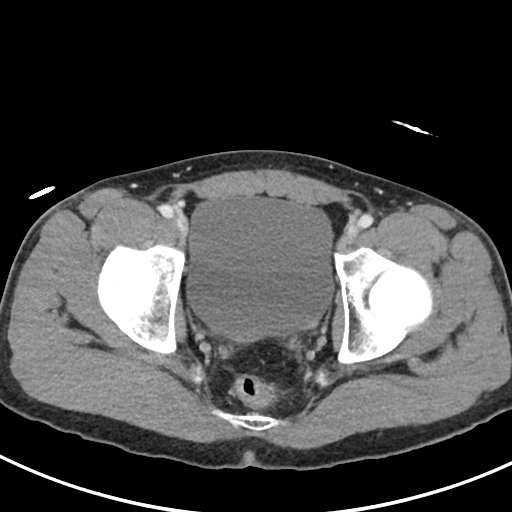
[im 34/133  soft-tissue]
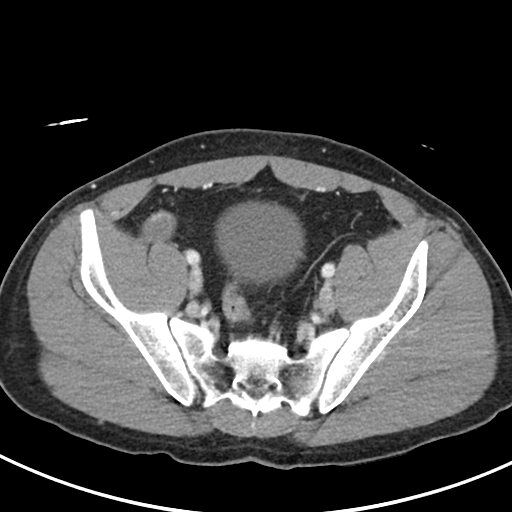
[im 45/133  soft-tissue]
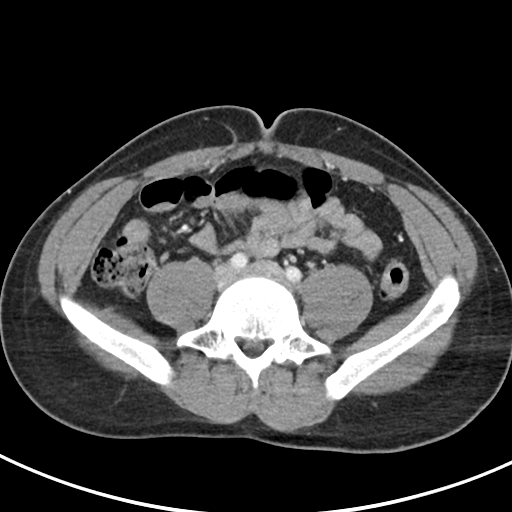
[im 56/133  soft-tissue]
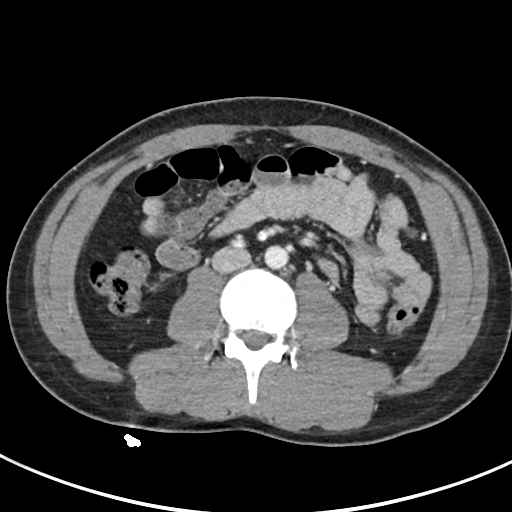
[im 78/133  soft-tissue]
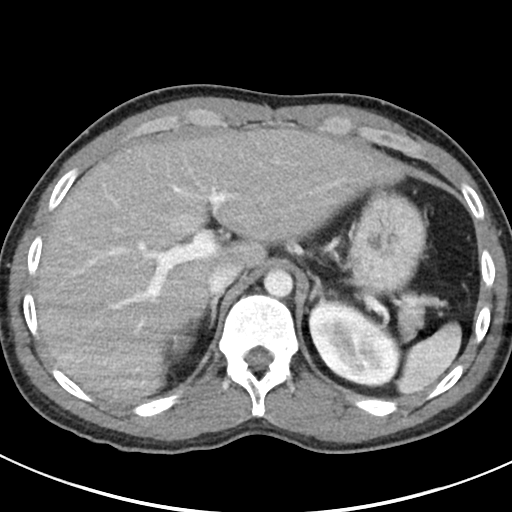
[im 89/133  soft-tissue]
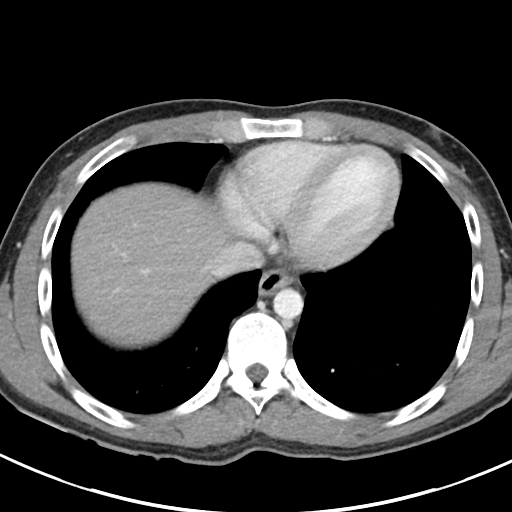
[im 100/133  soft-tissue]
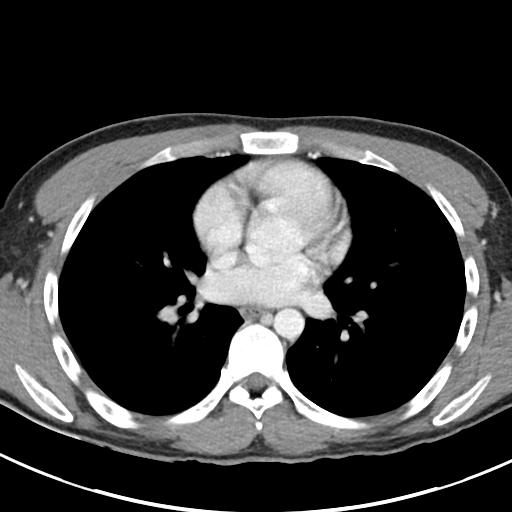
[im 111/133  soft-tissue]
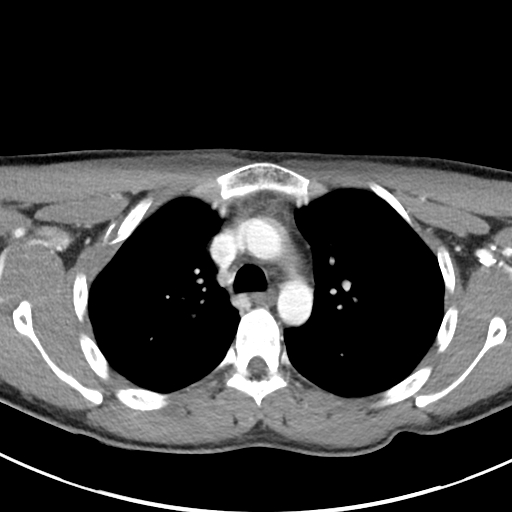
[im 111/133  bone]
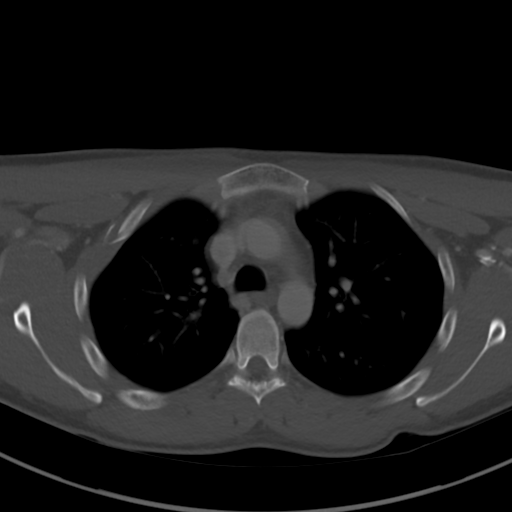
[im 122/133  soft-tissue]
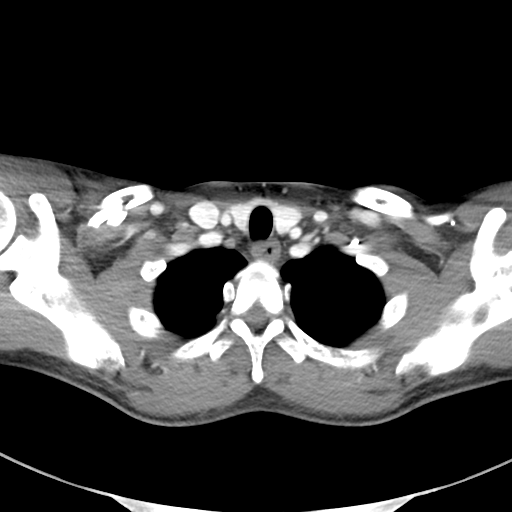

[Series 7: cor · coronal · 0.59mm/px · 3 of 71 slices shown]
[im 24/71  soft-tissue]
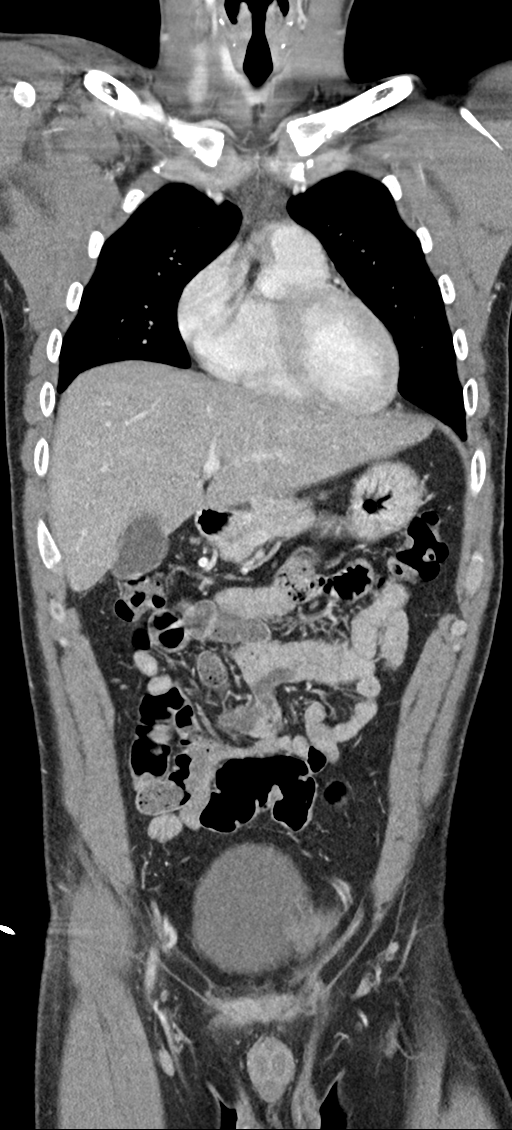
[im 32/71  soft-tissue]
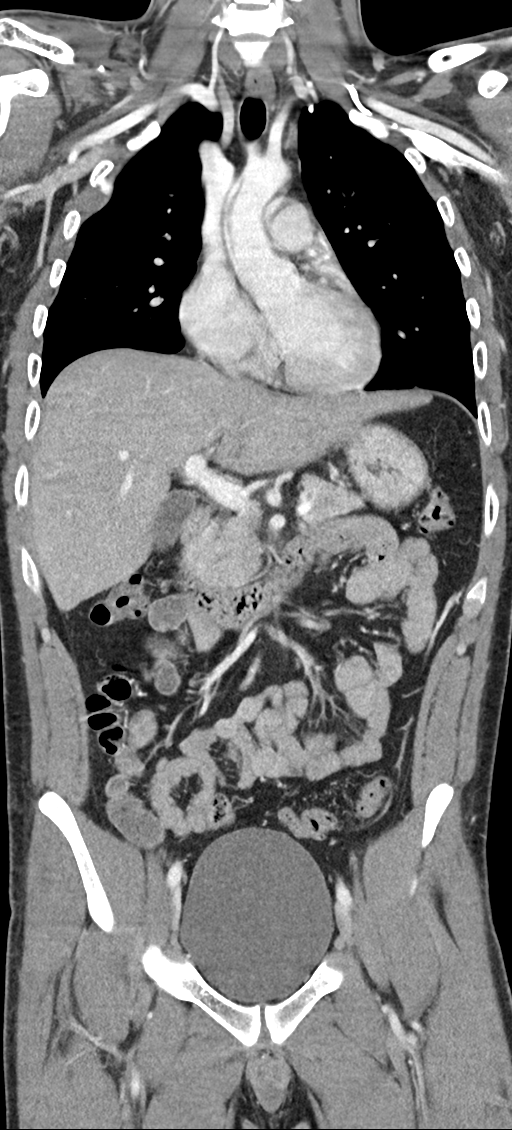
[im 39/71  soft-tissue]
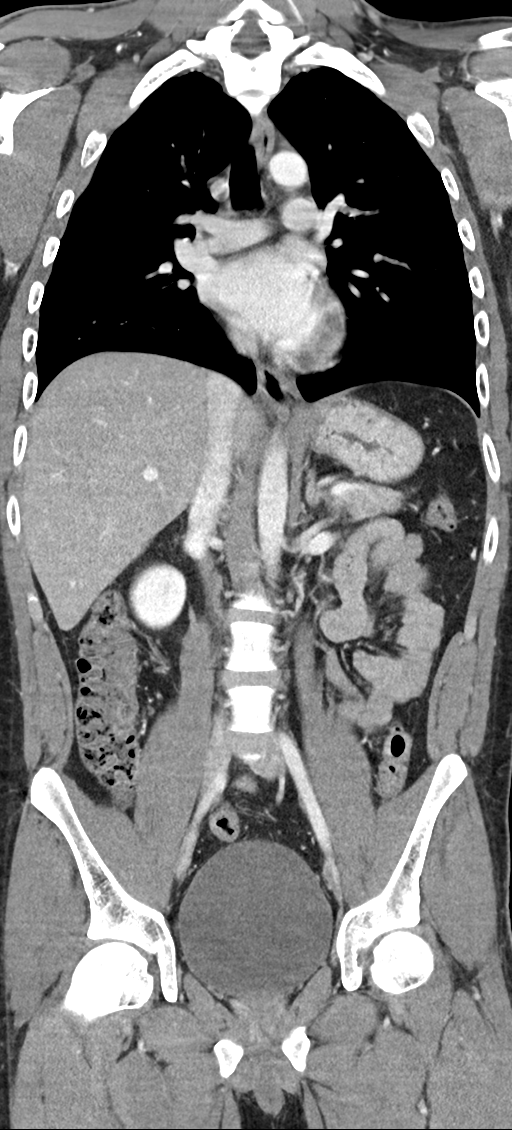

[13 of 46 positions shown; findings below may reference images not displayed]

FINDINGS: CT CHEST FINDINGS

Cardiovascular: Heart is normal size. Aorta is normal caliber. No
evidence of aortic dissection or aortic injury.

Mediastinum/Nodes: No mediastinal, hilar, or axillary adenopathy. No
evidence of mediastinal hematoma.

Lungs/Pleura: No confluent airspace opacities, effusion or
pneumothorax.

Musculoskeletal: Comminuted mid right clavicle fracture noted.
Fracture through the anterior right 2nd rib.

CT ABDOMEN PELVIS FINDINGS

Hepatobiliary: Calcification within the right hepatic lobe appears
benign, possibly related to old granuloma. No evidence of attic
injury or perihepatic hematoma.

Pancreas: No focal abnormality or ductal dilatation.

Spleen: No splenic injury or perisplenic hematoma.

Adrenals/Urinary Tract: No adrenal hemorrhage or renal injury
identified. Bladder is unremarkable.

Stomach/Bowel: Stomach, large and small bowel grossly unremarkable.

Vascular/Lymphatic: No evidence of aneurysm or adenopathy.

Reproductive: No visible focal abnormality.

Other: No free fluid or free air.

Musculoskeletal: No acute bony abnormality.
IMPRESSION: Comminuted fracture through the mid right clavicle. Anterior right
2nd rib fracture. No associated effusion or pneumothorax.

No acute findings in the abdomen or pelvis.
